# Patient Record
Sex: Female | Born: 2000 | Race: Black or African American | Hispanic: No | Marital: Single | Smoking: Never smoker
Health system: Southern US, Community
[De-identification: ages and names within clinical notes are randomized; demographics above are authoritative.]

## PROBLEM LIST (undated history)

## (undated) DIAGNOSIS — L309 Dermatitis, unspecified: Secondary | ICD-10-CM

## (undated) DIAGNOSIS — F419 Anxiety disorder, unspecified: Secondary | ICD-10-CM

## (undated) DIAGNOSIS — R040 Epistaxis: Secondary | ICD-10-CM

---
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
## 2022-03-16 ENCOUNTER — Emergency Department (HOSPITAL_BASED_OUTPATIENT_CLINIC_OR_DEPARTMENT_OTHER)
Admission: EM | Admit: 2022-03-16 | Discharge: 2022-03-16 | Disposition: A | Discharge status: Home / Self Care | Payer: BC Managed Care – PPO | Attending: Emergency Medicine | Admitting: Emergency Medicine

## 2022-03-16 ENCOUNTER — Encounter (HOSPITAL_BASED_OUTPATIENT_CLINIC_OR_DEPARTMENT_OTHER): Payer: Self-pay | Admitting: Emergency Medicine

## 2022-03-16 ENCOUNTER — Other Ambulatory Visit: Payer: Self-pay

## 2022-03-16 ENCOUNTER — Emergency Department (HOSPITAL_BASED_OUTPATIENT_CLINIC_OR_DEPARTMENT_OTHER): Payer: BC Managed Care – PPO

## 2022-03-16 DIAGNOSIS — Z1152 Encounter for screening for COVID-19: Secondary | ICD-10-CM | POA: Insufficient documentation

## 2022-03-16 DIAGNOSIS — R059 Cough, unspecified: Secondary | ICD-10-CM | POA: Diagnosis present

## 2022-03-16 DIAGNOSIS — J101 Influenza due to other identified influenza virus with other respiratory manifestations: Secondary | ICD-10-CM

## 2022-03-16 LAB — RESP PANEL BY RT-PCR (RSV, FLU A&B, COVID)  RVPGX2
Influenza A by PCR: NEGATIVE
Influenza B by PCR: POSITIVE — AB
Resp Syncytial Virus by PCR: NEGATIVE
SARS Coronavirus 2 by RT PCR: NEGATIVE

## 2022-03-16 LAB — GROUP A STREP BY PCR: Group A Strep by PCR: NOT DETECTED

## 2022-03-16 MED ORDER — DM-GUAIFENESIN ER 30-600 MG PO TB12: 1.0000 | ORAL_TABLET | Freq: Two times a day (BID) | ORAL | 0 refills | Status: AC

## 2022-03-16 MED ORDER — ACETAMINOPHEN 500 MG PO TABS
1000.0000 mg | ORAL_TABLET | Freq: Once | ORAL | Status: AC
  Administered 2022-03-16: 1000 mg via ORAL
  Filled 2022-03-16: qty 2

## 2022-03-16 MED ORDER — SODIUM CHLORIDE 0.9 % IV BOLUS: 1000.0000 mL | Freq: Once | INTRAVENOUS | Status: DC

## 2022-03-16 NOTE — Discharge Instructions (Signed)
You were seen in the emergency department today for cough and fever.  You have the flu.  I have prescribed you a cough and congestion medication called Mucinex DM that you can take twice daily as needed for your symptoms.  Please continue drink plenty of fluids and eat a gentle diet.  Please return to emergency department for worsening shortness of breath or difficulty breathing.

## 2022-03-16 NOTE — ED Provider Notes (Signed)
[facility] EMERGENCY DEPARTMENT Provider Note   CSN: [id number] Arrival date & time: 03/16/22  0846     History  Chief Complaint  Patient presents with   URI    Sabrina Dodson is a 22 y.o. female. With past medical history of MDD who presents to the emergency department with cough.   States that beginning 2 weeks ago she began having cough and headache.  She states that at that time her boyfriend was sick with similar symptoms.  She states that over the past 2 weeks she is continue to have cough.  She states that this morning she woke up feeling significantly worse.  She states that she woke up with sore throat, productive cough, body aches, headache.  She states that she also feels mildly short of breath because "I am trying to hold on my cough."  She denies shortness of breath on exertion.  She is also having chest wall tenderness with coughing but not otherwise.  She is unclear if she has had fever at home but she denies any chills or diaphoresis.  She had decreased p.o. intake over the past 2 days.  Denies abdominal pain, nausea, vomiting, diarrhea, dysuria.    URI Presenting symptoms: cough, fever and sore throat        Home Medications Prior to Admission medications   Medication Sig Start Date End Date Taking? Authorizing Provider  dextromethorphan-guaiFENesin (MUCINEX DM) 30-600 MG 12hr tablet Take 1 tablet by mouth 2 (two) times daily. 03/16/22   Mickie Hillier, PA-C  naproxen (NAPROSYN) 375 MG tablet Take 1 tablet twice daily as needed for chest wall pain. 11/01/19   Molpus, John, MD  ferrous sulfate 325 (65 FE) MG tablet Take 1 tablet (325 mg total) by mouth daily with breakfast. Patient not taking: Reported on 04/24/2018 12/28/16 11/01/19  Sharin Mons, MD      Allergies    Lactose intolerance (gi)    Review of Systems   Review of Systems  Constitutional:  Positive for fever.  HENT:  Positive for sore throat.   Respiratory:  Positive for cough and shortness  of breath.   All other systems reviewed and are negative.   Physical Exam Updated Vital Signs BP 104/61   Pulse 70   Temp 99.7 F (37.6 C) (Oral)   Resp 16   Ht 5\' 3"  (1.6 m)   Wt 68 kg   LMP 03/15/2022   SpO2 100%   BMI 26.57 kg/m  Physical Exam Vitals and nursing note reviewed.  Constitutional:      General: She is not in acute distress.    Appearance: She is ill-appearing. She is not toxic-appearing or diaphoretic.  HENT:     Head: Normocephalic and atraumatic.     Nose: Congestion present.     Mouth/Throat:     Mouth: Mucous membranes are moist.     Pharynx: Posterior oropharyngeal erythema present. No oropharyngeal exudate.  Eyes:     General: No scleral icterus.    Extraocular Movements: Extraocular movements intact.     Pupils: Pupils are equal, round, and reactive to light.  Cardiovascular:     Rate and Rhythm: Normal rate and regular rhythm.     Pulses: Normal pulses.     Heart sounds: No murmur heard. Pulmonary:     Effort: Pulmonary effort is normal. No respiratory distress.     Breath sounds: Normal breath sounds. No wheezing, rhonchi or rales.  Abdominal:     General: There is  no distension.     Palpations: Abdomen is soft.     Tenderness: There is no abdominal tenderness.  Musculoskeletal:        General: Normal range of motion.     Cervical back: Neck supple. No rigidity.  Skin:    General: Skin is warm and dry.     Capillary Refill: Capillary refill takes less than 2 seconds.     Findings: No rash.  Neurological:     General: No focal deficit present.     Mental Status: She is alert and oriented to person, place, and time. Mental status is at baseline.  Psychiatric:        Mood and Affect: Mood normal.        Behavior: Behavior normal.        Thought Content: Thought content normal.        Judgment: Judgment normal.     ED Results / Procedures / Treatments   Labs (all labs ordered are listed, but only abnormal results are displayed) Labs  Reviewed  RESP PANEL BY RT-PCR (RSV, FLU A&B, COVID)  RVPGX2 - Abnormal; Notable for the following components:      Result Value   Influenza B by PCR POSITIVE (*)    All other components within normal limits  GROUP A STREP BY PCR    EKG None  Radiology No results found.  Procedures Procedures    Medications Ordered in ED Medications  acetaminophen (TYLENOL) tablet 1,000 mg (1,000 mg Oral Given 03/16/22 0900)    ED Course/ Medical Decision Making/ A&P                           Medical Decision Making Amount and/or Complexity of Data Reviewed Radiology: ordered.  Risk OTC drugs.  Initial Impression and Ddx 22 year old female who presents to the emergency department with cough.  She is ill-appearing but nonseptic and nontoxic in appearance.  Congested.  She does have oropharyngeal erythema and some tonsillar swelling without exudates.  Given her length of symptoms and then worsening today will obtain a chest x-ray, respiratory panel.  A strep was ordered by triage and has already been collected although I doubt that she has strep throat at this time. Patient PMH that increases complexity of ED encounter: MDD  Interpretation of Diagnostics I independent reviewed and interpreted the labs as followed: Influenza B positive, strep negative  - I independently visualized the following imaging with scope of interpretation limited to determining acute life threatening conditions related to emergency care: Chest x-ray, which revealed no acute findings  Patient Reassessment and Ultimate Disposition/Management On exam she has cough, oropharyngeal erythema, tonsillar swelling without exudates.  Based on [facility] criteria do not feel that she has strep throat.  This was ordered in triage and is negative. She is ill-appearing but nonseptic in appearance.  She is euvolemic on exam.  She did present febrile up to 103 with mild tachycardia.  Oxygenating well on room air without hypoxia.  She does  not appear objectively. We obtained a chest x-ray given the length of her symptoms and worsening today with significant fever.  This was negative for any pneumonia, pneumothorax, pleural effusions. She was given Tylenol here and defervesced.  Her heart rate came down with this. I did consider PE, initial PERC criteria of 1 given her heart rate of 109, however she is Wells low risk and has no other risk factors such as recent long flights or  drives, surgery, malignancy, history of clots.  She does not take exogenous estrogen, smoke, no lower extremity swelling.  Will not obtain D-dimer or CTA PE study.  Feel that her tachycardia is likely driven by fever and viral illness at this time.  Initially ordered IV fluids, however patient heart rate and temp came down with just Tylenol.  She is tolerating oral p.o. at this time without nausea or vomiting.  Feel that she can continue to drink fluids at home.  Will prescribe her Tessalon as well as Mucinex.  Given her return precautions for worsening shortness of breath or difficulty breathing.  The patient has been appropriately medically screened and/or stabilized in the ED. I have low suspicion for any other emergent medical condition which would require further screening, evaluation or treatment in the ED or require inpatient management. At time of discharge the patient is hemodynamically stable and in no acute distress. I have discussed work-up results and diagnosis with patient and answered all questions. Patient is agreeable with discharge plan. We discussed strict return precautions for returning to the emergency department and they verbalized understanding.     Patient management required discussion with the following services or consulting groups:  None  Complexity of Problems Addressed Acute complicated illness or Injury  Additional Data Reviewed and Analyzed Further history obtained from: Past medical history and medications listed in the EMR,  Care Everywhere, and Prior labs/imaging results  Patient Encounter Risk Assessment Prescriptions  Final Clinical Impression(s) / ED Diagnoses Final diagnoses:  Influenza B    Rx / DC Orders ED Discharge Orders          Ordered    dextromethorphan-guaiFENesin (MUCINEX DM) 30-600 MG 12hr tablet  2 times daily       Note to Pharmacy: Dispense any convenient trade quantity.   03/16/22 1047              Mickie Hillier, PA-C 22/48/25 0037    Lianne Cure, DO 04/88/89 [phone]

## 2022-03-16 NOTE — ED Notes (Signed)
Pt taking PO fluids well

## 2022-03-16 NOTE — ED Triage Notes (Signed)
Fever , sore throat , cough , chest congestion , shortness of breath . All started today

## 2022-05-15 IMAGING — CR DG CHEST 2V
2 series · 2 of 2 positions shown · non-contrast
Comparison: 07/11/2016

CLINICAL DATA: Cough

EXAM:
CHEST - 2 VIEW

[w chest pa]
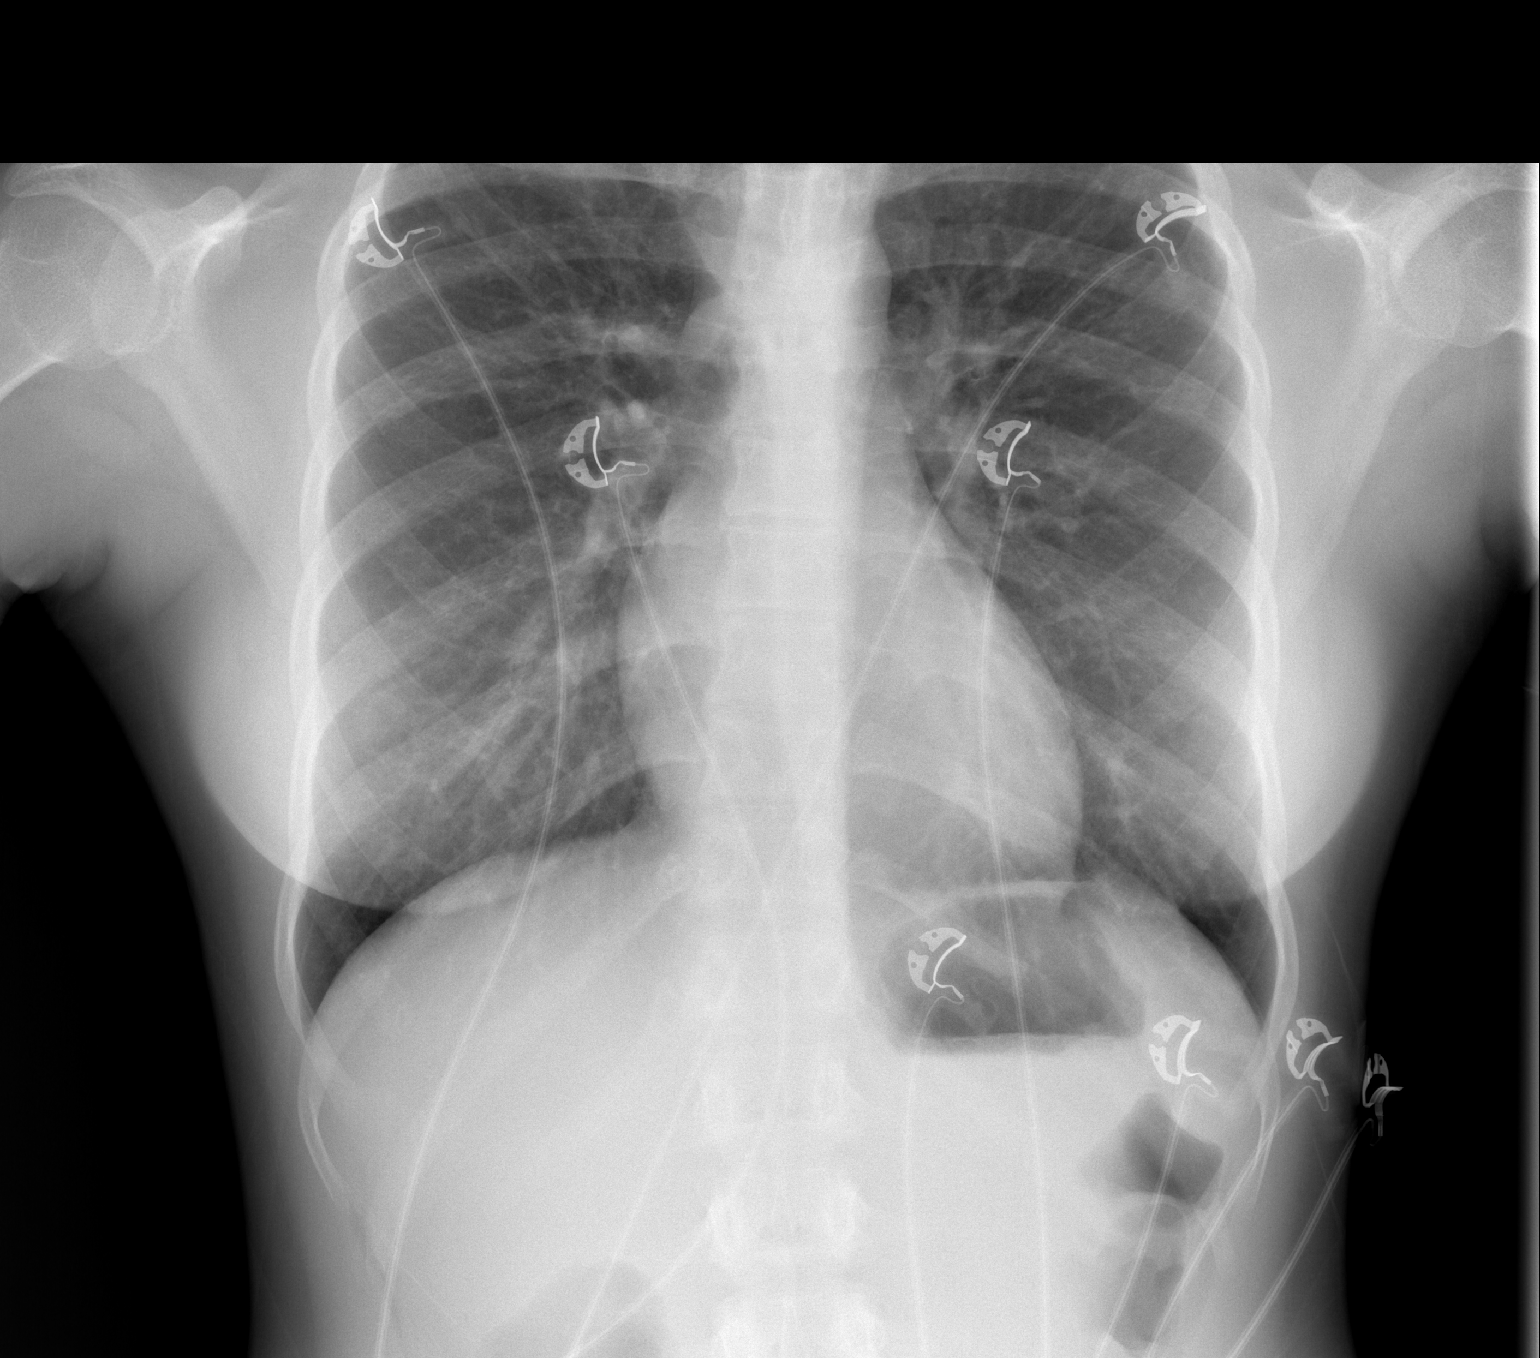

[w chest lat]
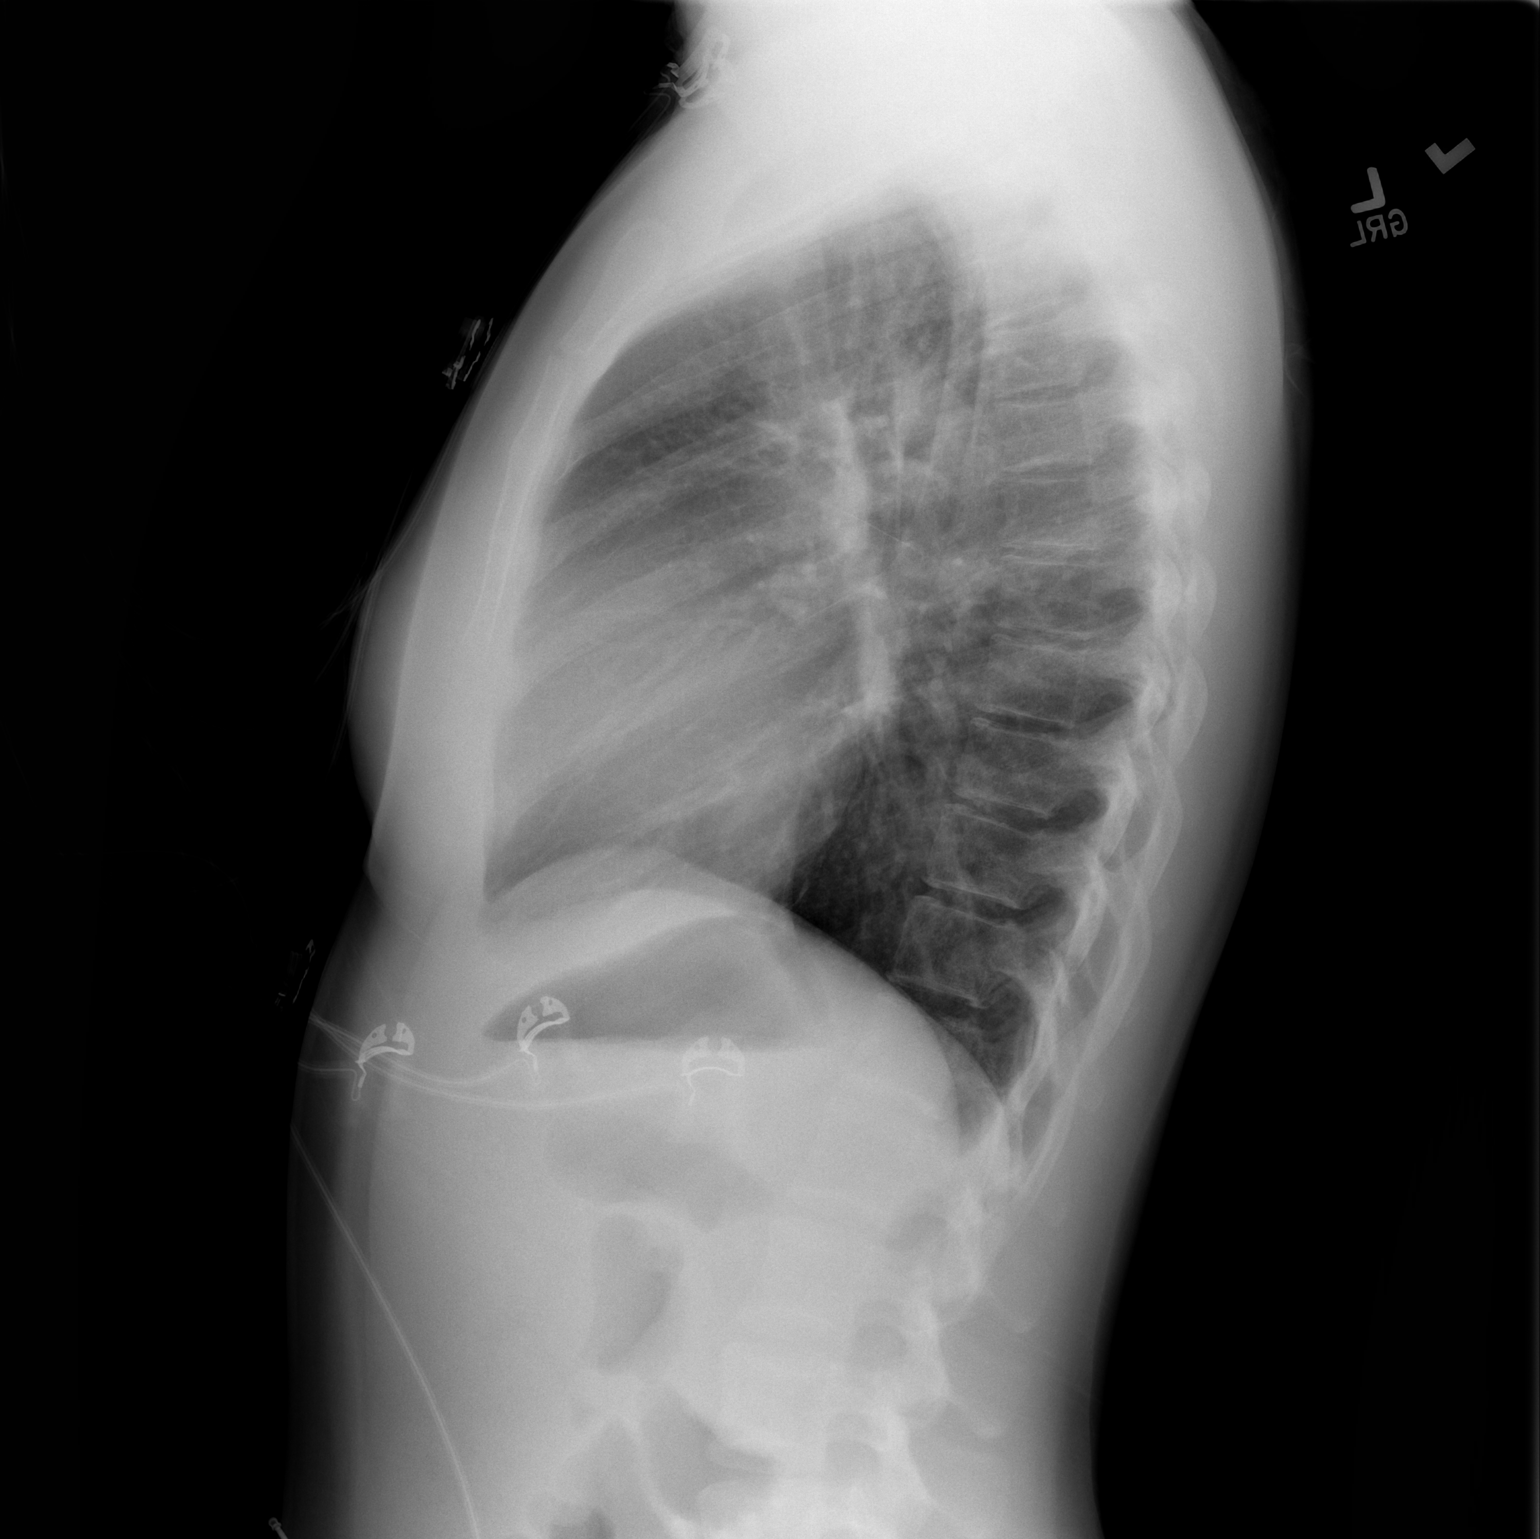

[2 of 2 positions shown; findings below may reference images not displayed]

FINDINGS: The heart size and mediastinal contours are within normal limits.
Both lungs are clear. The visualized skeletal structures are
unremarkable.
IMPRESSION: No active cardiopulmonary disease.

## 2022-09-30 ENCOUNTER — Emergency Department (HOSPITAL_COMMUNITY)
Admission: EM | Admit: 2022-09-30 | Discharge: 2022-09-30 | Disposition: A | Discharge status: Home / Self Care | Payer: BC Managed Care – PPO | Attending: Emergency Medicine | Admitting: Emergency Medicine

## 2022-09-30 ENCOUNTER — Encounter (HOSPITAL_COMMUNITY): Payer: Self-pay

## 2022-09-30 ENCOUNTER — Other Ambulatory Visit: Payer: Self-pay

## 2022-09-30 DIAGNOSIS — R590 Localized enlarged lymph nodes: Secondary | ICD-10-CM | POA: Insufficient documentation

## 2022-09-30 DIAGNOSIS — Z1152 Encounter for screening for COVID-19: Secondary | ICD-10-CM | POA: Insufficient documentation

## 2022-09-30 DIAGNOSIS — R1084 Generalized abdominal pain: Secondary | ICD-10-CM | POA: Insufficient documentation

## 2022-09-30 DIAGNOSIS — H1089 Other conjunctivitis: Secondary | ICD-10-CM | POA: Insufficient documentation

## 2022-09-30 LAB — RESP PANEL BY RT-PCR (RSV, FLU A&B, COVID)  RVPGX2
Influenza A by PCR: NEGATIVE
Influenza B by PCR: NEGATIVE
Resp Syncytial Virus by PCR: NEGATIVE
SARS Coronavirus 2 by RT PCR: NEGATIVE

## 2022-09-30 LAB — URINALYSIS, ROUTINE W REFLEX MICROSCOPIC
Bacteria, UA: NONE SEEN
Bilirubin Urine: NEGATIVE
Glucose, UA: NEGATIVE mg/dL
Ketones, ur: NEGATIVE mg/dL
Leukocytes,Ua: NEGATIVE
Nitrite: NEGATIVE
Protein, ur: NEGATIVE mg/dL
Specific Gravity, Urine: 1.001 — ABNORMAL LOW (ref 1.005–1.030)
pH: 6 (ref 5.0–8.0)

## 2022-09-30 LAB — COMPREHENSIVE METABOLIC PANEL
ALT: 11 U/L (ref 0–44)
AST: 17 U/L (ref 15–41)
Albumin: 4.1 g/dL (ref 3.5–5.0)
Alkaline Phosphatase: 54 U/L (ref 38–126)
Anion gap: 12 (ref 5–15)
BUN: 6 mg/dL (ref 6–20)
CO2: 22 mmol/L (ref 22–32)
Calcium: 9.4 mg/dL (ref 8.9–10.3)
Chloride: 103 mmol/L (ref 98–111)
Creatinine, Ser: 0.7 mg/dL (ref 0.44–1.00)
GFR, Estimated: >60 mL/min (ref >60)
Glucose, Bld: 99 mg/dL (ref 70–99)
Potassium: 3.6 mmol/L (ref 3.5–5.1)
Sodium: 137 mmol/L (ref 135–145)
Total Bilirubin: 0.9 mg/dL (ref 0.3–1.2)
Total Protein: 8.3 g/dL — ABNORMAL HIGH (ref 6.5–8.1)

## 2022-09-30 LAB — CBC WITH DIFFERENTIAL/PLATELET
Abs Immature Granulocytes: 0.01 10*3/uL (ref 0.00–0.07)
Basophils Absolute: 0.1 10*3/uL (ref 0.0–0.1)
Basophils Relative: 1 %
Eosinophils Absolute: 0 10*3/uL (ref 0.0–0.5)
Eosinophils Relative: 1 %
HCT: 36.2 % (ref 36.0–46.0)
Hemoglobin: 10.7 g/dL — ABNORMAL LOW (ref 12.0–15.0)
Immature Granulocytes: 0 %
Lymphocytes Relative: 42 %
Lymphs Abs: 2.6 10*3/uL (ref 0.7–4.0)
MCH: 22.8 pg — ABNORMAL LOW (ref 26.0–34.0)
MCHC: 29.6 g/dL — ABNORMAL LOW (ref 30.0–36.0)
MCV: 77 fL — ABNORMAL LOW (ref 80.0–100.0)
Monocytes Absolute: 0.5 10*3/uL (ref 0.1–1.0)
Monocytes Relative: 7 %
Neutro Abs: 3.1 10*3/uL (ref 1.7–7.7)
Neutrophils Relative %: 49 %
Platelets: 387 10*3/uL (ref 150–400)
RBC: 4.7 MIL/uL (ref 3.87–5.11)
RDW: 15.1 % (ref 11.5–15.5)
WBC: 6.3 10*3/uL (ref 4.0–10.5)
nRBC: 0 % (ref 0.0–0.2)

## 2022-09-30 LAB — LIPASE, BLOOD: Lipase: 40 U/L (ref 11–51)

## 2022-09-30 LAB — POC URINE PREG, ED: Preg Test, Ur: NEGATIVE

## 2022-09-30 MED ORDER — TETRACAINE HCL 0.5 % OP SOLN
1.0000 [drp] | Freq: Once | OPHTHALMIC | Status: AC
  Administered 2022-09-30: 2 [drp] via OPHTHALMIC
  Filled 2022-09-30: qty 4

## 2022-09-30 MED ORDER — FLUORESCEIN SODIUM 1 MG OP STRP
1.0000 | ORAL_STRIP | Freq: Once | OPHTHALMIC | Status: AC
  Administered 2022-09-30: 1 via OPHTHALMIC
  Filled 2022-09-30: qty 1

## 2022-09-30 MED ORDER — POLYMYXIN B-TRIMETHOPRIM 10000-0.1 UNIT/ML-% OP SOLN: 2.0000 [drp] | Freq: Four times a day (QID) | OPHTHALMIC | 0 refills | Status: AC

## 2022-09-30 MED ORDER — DOXYCYCLINE HYCLATE 100 MG PO CAPS: 100.0000 mg | ORAL_CAPSULE | Freq: Two times a day (BID) | ORAL | 0 refills | Status: AC

## 2022-09-30 MED ORDER — CEFTRIAXONE SODIUM 1 G IJ SOLR
500.0000 mg | Freq: Once | INTRAMUSCULAR | Status: AC
  Administered 2022-09-30: 500 mg via INTRAMUSCULAR
  Filled 2022-09-30: qty 10

## 2022-09-30 MED ORDER — STERILE WATER FOR INJECTION IJ SOLN
INTRAMUSCULAR | Status: AC
  Filled 2022-09-30: qty 10

## 2022-09-30 NOTE — ED Provider Notes (Signed)
[facility] EMERGENCY DEPARTMENT AT [facility] Provider Note   CSN: [id number] Arrival date & time: 09/30/22  0559     History  Chief Complaint  Patient presents with   Dizziness   Eye Problem    Sabrina Dodson is a 22 y.o. female who presents today with concern for bilateral eye redness and discharge, sore throat, abdominal pain, nausea that started last night.  She reports that her eyes are crusted shut when she wakes up and has to use water to help open them.  She does not use any contact lenses. She denies any fevers, chills, emesis, cough, shortness of breath.  Denies any sick contacts.  She does note a history of frequent STIs and last had sexual intercourse 6 days ago.  Denies any abnormal genital discharge, burning or itching, dysuria, hematuria.  Last menstrual period was 7/20.   Dizziness Eye Problem      Home Medications Prior to Admission medications   Medication Sig Start Date End Date Taking? Authorizing Provider  doxycycline (VIBRAMYCIN) 100 MG capsule Take 1 capsule (100 mg total) by mouth 2 (two) times daily for 7 days. 09/30/22 10/07/22 Yes Arabella Merles, PA-C  trimethoprim-polymyxin b (POLYTRIM) ophthalmic solution Place 2 drops into both eyes every 6 (six) hours for 7 days. 09/30/22 10/07/22 Yes Arabella Merles, PA-C  dextromethorphan-guaiFENesin (MUCINEX DM) 30-600 MG 12hr tablet Take 1 tablet by mouth 2 (two) times daily. 03/16/22   Cristopher Peru, PA-C  naproxen (NAPROSYN) 375 MG tablet Take 1 tablet twice daily as needed for chest wall pain. 11/01/19   Molpus, John, MD  ferrous sulfate 325 (65 FE) MG tablet Take 1 tablet (325 mg total) by mouth daily with breakfast. Patient not taking: Reported on 04/24/2018 12/28/16 11/01/19  Glennon Hamilton, MD      Allergies    Lactose intolerance (gi)    Review of Systems   Review of Systems  HENT:         Eye redness  Gastrointestinal:  Positive for abdominal pain.    Physical Exam Updated Vital  Signs BP 118/70 (BP Location: Right Arm)   Pulse (!) 59   Temp 98.3 F (36.8 C) (Oral)   Resp 16   SpO2 100%  Physical Exam Vitals and nursing note reviewed.  Constitutional:      General: She is not in acute distress. HENT:     Head: Normocephalic and atraumatic.     Mouth/Throat:     Mouth: Mucous membranes are dry.     Pharynx: No oropharyngeal exudate or posterior oropharyngeal erythema.  Eyes:     Extraocular Movements: Extraocular movements intact.     Pupils: Pupils are equal, round, and reactive to light.     Comments: Erythematous conjunctiva bilaterally Areas of crusting in the eye creases No obvious purulent drainage  Visual acuity OD, OS, OU  20/30 no signs of abrasion or ulceration with fluorescein eye stain.   Tonometry performed intraocular pressure 17 on the right, 19 on the left  Neck:     Comments: Right anterior cervical lymphadenopathy Cardiovascular:     Rate and Rhythm: Normal rate and regular rhythm.     Heart sounds: Murmur heard.  Pulmonary:     Effort: Pulmonary effort is normal. No respiratory distress.     Breath sounds: Normal breath sounds.  Abdominal:     General: Abdomen is flat. Bowel sounds are normal. There is no distension.     Palpations: Abdomen is soft.  Tenderness: There is no right CVA tenderness or left CVA tenderness.     Comments: Diffuse tenderness to palpation  Musculoskeletal:        General: Normal range of motion.     Cervical back: Normal range of motion and neck supple. No rigidity.  Skin:    General: Skin is warm and dry.  Neurological:     General: No focal deficit present.     Mental Status: She is alert.     Cranial Nerves: No cranial nerve deficit.  Psychiatric:        Mood and Affect: Mood normal.     ED Results / Procedures / Treatments   Labs (all labs ordered are listed, but only abnormal results are displayed) Labs Reviewed  CBC WITH DIFFERENTIAL/PLATELET - Abnormal; Notable for the following  components:      Result Value   Hemoglobin 10.7 (*)    MCV 77.0 (*)    MCH 22.8 (*)    MCHC 29.6 (*)    All other components within normal limits  COMPREHENSIVE METABOLIC PANEL - Abnormal; Notable for the following components:   Total Protein 8.3 (*)    All other components within normal limits  URINALYSIS, ROUTINE W REFLEX MICROSCOPIC - Abnormal; Notable for the following components:   Color, Urine COLORLESS (*)    Specific Gravity, Urine 1.001 (*)    Hgb urine dipstick SMALL (*)    All other components within normal limits  RESP PANEL BY RT-PCR (RSV, FLU A&B, COVID)  RVPGX2  LIPASE, BLOOD  POC URINE PREG, ED  GC/CHLAMYDIA PROBE AMP ([place]) NOT AT [facility]  GC/CHLAMYDIA PROBE AMP ([place]) NOT AT [facility]    EKG None  Radiology No results found.  Procedures Procedures    Medications Ordered in ED Medications  cefTRIAXone (ROCEPHIN) injection 500 mg (has no administration in time range)  fluorescein ophthalmic strip 1 strip (1 strip Both Eyes Given by Other 09/30/22 0945)  tetracaine (PONTOCAINE) 0.5 % ophthalmic solution 1-2 drop (2 drops Both Eyes Given by Other 09/30/22 0946)    ED Course/ Medical Decision Making/ A&P                             Medical Decision Making Amount and/or Complexity of Data Reviewed Labs: ordered.   22 y.o. female presents to the ED for concern of bilateral eye redness and eye crusting, abdominal pain for 1 day  Differential diagnosis includes but is not limited to bacterial conjunctivitis, viral conjunctivitis, upper respiratory infection, appendicitis, cholecystitis, cholangitis, UTI, PID  ED Course:  Patient overall well-appearing except for obvious bilateral eye conjunctival erythema and crusting.  No obvious or copious purulent drainage, less concern for gonorrheal conjunctivitis at this time.  Visual acuity unchanged.  Suspect bacterial conjunctivitis. Patient also notes some abdominal pain.  She has some mild tenderness to  palpation of her lower abdomen. Her vital signs are all within normal limits, no white count, no elevation in LFTs or lipase, no signs of UTI.  She denies any pelvic pain, vaginal discharge or complaints, less concern for any pelvic inflammatory disease at this time.  Do not feel she needs imaging at this time. Patient does not want anything for pain or nausea at this time. Eye exam with fluorescein eye stain, no signs of abrasion or ulceration.  Tonometry performed intraocular pressure 17 on the right, 19 on the left, no concern for glaucoma at this time. Vaginal  and ophthalmic gonorrhea chlamydia swab obtained, patient will be informed of results these come back positive.  Will treat prophylactically for possible gonorrheal conjunctivitis due to high risk.  Will also treat for regular conjunctivitis with Polytrim drops.    Impression: Bilateral conjunctivitis  Disposition:  The patient was discharged home with instructions to take Polytrim and doxycycline as prescribed.  With primary care provider in 3 days if symptoms have not improved. Return precautions given.  Lab Tests: I Ordered, and personally interpreted labs.  The pertinent results include:   CBC with no leukocytosis, hemoglobin 10.7 but consistent with baseline CMP and lipase unremarkable RSV, flu, COVID-negative Urinalysis unremarkable, urine pregnancy negative             Final Clinical Impression(s) / ED Diagnoses Final diagnoses:  Other conjunctivitis of both eyes    Rx / DC Orders ED Discharge Orders          Ordered    doxycycline (VIBRAMYCIN) 100 MG capsule  2 times daily        09/30/22 1050    trimethoprim-polymyxin b (POLYTRIM) ophthalmic solution  Every 6 hours        09/30/22 1050              Arabella Merles, PA-C 09/30/22 1055    Arby Barrette, MD 10/17/22 1720

## 2022-09-30 NOTE — Discharge Instructions (Addendum)
You have been prescribed doxycycline. Take this antibiotic by mouth 2 times a day for the next 7 days. Take the full course of your antibiotic even if you start feeling better. Antibiotics may cause you to have diarrhea.  This antibiotic may also cause you to be more sensitive to sunlight.  Please avoid direct sunlight and cover up with sunscreen.  You have also been prescribed polymyxin B/trimethoprim (Polytrim). Place 2 drops in each eye every 6 hours while awake for the next 7 days. Take the full course of your antibiotic even if you start feeling better.   Please continue to wash your hands frequently.  Do not touch your eyes.  Please see your PCP if your symptoms do not start to improve over the next 3 days.  Return to the ER if you have changes in vision, eye pain, your symptoms do not improve, you have any new or concerning symptoms.

## 2022-09-30 NOTE — ED Notes (Signed)
POC Pregnancy Completed, and Urine sent down to lab for Urinalysis

## 2022-09-30 NOTE — ED Triage Notes (Addendum)
Pt. Arrives POV for red eyes x3 days. She states that she woke up Sunday and her eyes were stuck together. She tried to use some eyedrops that were prescribed to her for a stye, but they did not work. She has been dizzy since. She states the dizziness has gotten worse. She also c/o of throat pain.
